# Patient Record
Sex: Female | Born: 1945 | Race: White | Hispanic: No | Marital: Single | Smoking: Never smoker
Health system: Southern US, Community
[De-identification: ages and names within clinical notes are randomized; demographics above are authoritative.]

## PROBLEM LIST (undated history)

## (undated) DIAGNOSIS — F419 Anxiety disorder, unspecified: Secondary | ICD-10-CM

## (undated) HISTORY — PX: TONSILLECTOMY: SUR1361

---

## 2001-06-09 ENCOUNTER — Emergency Department (HOSPITAL_COMMUNITY): Admission: EM | Admit: 2001-06-09 | Discharge: 2001-06-09 | Payer: Self-pay | Admitting: Emergency Medicine

## 2001-06-28 ENCOUNTER — Encounter: Payer: Self-pay | Admitting: Family Medicine

## 2001-06-28 ENCOUNTER — Ambulatory Visit (HOSPITAL_COMMUNITY): Admission: RE | Admit: 2001-06-28 | Discharge: 2001-06-28 | Payer: Self-pay | Admitting: Family Medicine

## 2004-01-21 ENCOUNTER — Ambulatory Visit (HOSPITAL_COMMUNITY): Admission: RE | Admit: 2004-01-21 | Discharge: 2004-01-21 | Payer: Self-pay | Admitting: Gastroenterology

## 2004-01-21 ENCOUNTER — Encounter (INDEPENDENT_AMBULATORY_CARE_PROVIDER_SITE_OTHER): Payer: Self-pay | Admitting: Specialist

## 2006-11-17 ENCOUNTER — Encounter: Admission: RE | Admit: 2006-11-17 | Discharge: 2006-11-17 | Payer: Self-pay | Admitting: Gastroenterology

## 2007-06-26 ENCOUNTER — Emergency Department (HOSPITAL_COMMUNITY): Admission: EM | Admit: 2007-06-26 | Discharge: 2007-06-26 | Payer: Self-pay | Admitting: Emergency Medicine

## 2008-01-18 ENCOUNTER — Encounter: Admission: RE | Admit: 2008-01-18 | Discharge: 2008-03-14 | Payer: Self-pay | Admitting: Psychology

## 2011-03-07 ENCOUNTER — Other Ambulatory Visit: Payer: Self-pay | Admitting: Gastroenterology

## 2011-04-01 NOTE — Op Note (Signed)
NAME:  Cynthia Henry, Cynthia Henry                  ACCOUNT NO.:  [id number]   MEDICAL RECORD NO.:  [id number]                   PATIENT TYPE:  AMB   LOCATION:  ENDO                                 FACILITY:  [facility]   PHYSICIAN:  Petra Kuba, M.D.                 DATE OF BIRTH:  06/04/1946   DATE OF PROCEDURE:  01/21/2004  DATE OF DISCHARGE:                                 OPERATIVE REPORT   PROCEDURE:  Colonoscopy with biopsy.   INDICATIONS:  Patient with multiple bowel movements a day.  Sounds like  proctitis or colitis, although no blood or frank diarrhea.   Consent was signed after risks, benefits, methods, options thoroughly  discussed in the office.   MEDICINE USED:  Versed 9, Demerol 75.   PROCEDURE:  Rectal inspection was pertinent for external hemorrhoids.  Small  visual exam was negative.  The video pediatric adjustable colonoscope was  inserted with some difficulty due to a tortuous colon.  This was able to be  advanced to the cecum.  This did require rolling her over on her back and  some abdominal pressure with withdrawing and readvancement to try to  decrease the loops.  No obvious abnormality was seen on insertion.  Specifically, no signs of proctitis, colitis, or Crohn's disease.  The scope  was inserted shortways into the terminal ileum, which was normal.  Better  documentation and a few random biopsies were obtained.  The scope was slowly  withdrawn.  The prep was adequate.  There was some liquid stool that  required washing and suction.  It was slowly withdrawn through the colon.  No abnormalities were seen except for a tiny sigmoid polyp which was cold  biopsied x4.  Random colon biopsies were obtained to rule out any  microscopic colitis.  Anorectal pull-through and retroflexion confirmed some  small hemorrhoids but no signs of colitis or proctitis.  The scope was  reinserted shortways up the left side of the colon.  Air was suctioned.  The  scope removed.  The  patient tolerated the procedure well.  There were no  obvious immediate complications.   DIAGNOSES:  1. Internal and external hemorrhoids.  2. Tortuous colon.  3. A tiny sigmoid polyp, cold biopsied.  4. Otherwise within normal limits to the terminal ileum, status post random     biopsies throughout.   PLAN:  Await pathology to rule out microscopic colitis.  Questionable  further workup and plans versus possibly a trial of fiber or bulking agents.  Based on pathology, we will recheck symptoms and either follow up or proceed  with further workup and plans.                                               Petra Kuba, M.D.    MEM/MEDQ  D:  01/21/2004  T:  01/21/2004  Job:  [id number]   cc:   Dellis Anes. Idell Pickles, M.D.  [address]  [place]  [place] [address]  Fax: [phone]

## 2011-08-29 LAB — I-STAT 8, (EC8 V) (CONVERTED LAB)
Chloride: 106
Glucose, Bld: 95
Potassium: 3.9
pCO2, Ven: 52 — ABNORMAL HIGH
pH, Ven: 7.332 — ABNORMAL HIGH

## 2011-08-29 LAB — HEPATIC FUNCTION PANEL
Albumin: 4.1
Bilirubin, Direct: 0.2
Indirect Bilirubin: 0.6
Total Bilirubin: 0.8
Total Protein: 7.4

## 2011-08-29 LAB — DIFFERENTIAL
Basophils Absolute: 0
Eosinophils Relative: 1
Lymphocytes Relative: 38
Lymphs Abs: 1.7
Neutro Abs: 2.5
Neutrophils Relative %: 57

## 2011-08-29 LAB — URINALYSIS, ROUTINE W REFLEX MICROSCOPIC
Glucose, UA: NEGATIVE
Nitrite: NEGATIVE
Specific Gravity, Urine: 1.006
pH: 6

## 2011-08-29 LAB — CBC
HCT: 42.1
Platelets: 193
WBC: 4.4

## 2011-08-29 LAB — RAPID URINE DRUG SCREEN, HOSP PERFORMED
Barbiturates: NOT DETECTED
Cocaine: NOT DETECTED
Opiates: NOT DETECTED

## 2011-08-29 LAB — ETHANOL: Alcohol, Ethyl (B): 366 — ABNORMAL HIGH

## 2011-08-29 LAB — PROTIME-INR: Prothrombin Time: 12.4

## 2011-08-29 LAB — POCT I-STAT CREATININE
Creatinine, Ser: 1
Operator id: 146091

## 2011-08-29 LAB — LIPASE, BLOOD: Lipase: 30

## 2014-12-17 ENCOUNTER — Emergency Department (HOSPITAL_BASED_OUTPATIENT_CLINIC_OR_DEPARTMENT_OTHER)
Admission: EM | Admit: 2014-12-17 | Discharge: 2014-12-17 | Disposition: A | Discharge status: Home / Self Care | Payer: Medicare Other | Attending: Emergency Medicine | Admitting: Emergency Medicine

## 2014-12-17 ENCOUNTER — Encounter (HOSPITAL_BASED_OUTPATIENT_CLINIC_OR_DEPARTMENT_OTHER): Payer: Self-pay | Admitting: *Deleted

## 2014-12-17 DIAGNOSIS — W1809XA Striking against other object with subsequent fall, initial encounter: Secondary | ICD-10-CM | POA: Insufficient documentation

## 2014-12-17 DIAGNOSIS — S0990XA Unspecified injury of head, initial encounter: Secondary | ICD-10-CM

## 2014-12-17 DIAGNOSIS — F419 Anxiety disorder, unspecified: Secondary | ICD-10-CM | POA: Insufficient documentation

## 2014-12-17 DIAGNOSIS — Y998 Other external cause status: Secondary | ICD-10-CM | POA: Insufficient documentation

## 2014-12-17 DIAGNOSIS — Y9389 Activity, other specified: Secondary | ICD-10-CM | POA: Insufficient documentation

## 2014-12-17 DIAGNOSIS — S0101XA Laceration without foreign body of scalp, initial encounter: Secondary | ICD-10-CM | POA: Insufficient documentation

## 2014-12-17 DIAGNOSIS — Y9289 Other specified places as the place of occurrence of the external cause: Secondary | ICD-10-CM | POA: Insufficient documentation

## 2014-12-17 HISTORY — DX: Anxiety disorder, unspecified: F41.9

## 2014-12-17 MED ORDER — LIDOCAINE-EPINEPHRINE 2 %-1:100000 IJ SOLN
INTRAMUSCULAR | Status: AC
  Administered 2014-12-17: 5 mL
  Filled 2014-12-17: qty 1

## 2014-12-17 NOTE — ED Notes (Signed)
MD at bedside. 

## 2014-12-17 NOTE — ED Notes (Signed)
Pt c/o laceration to posterior head by wood doors 12 hrs ago

## 2014-12-17 NOTE — ED Provider Notes (Signed)
CSN: [id number]     Arrival date & time 12/17/14  2038 History  This chart was scribed for Cynthia ChickMartha K Linker, MD by Gwenyth Oberatherine Macek, ED Scribe. This patient was seen in room MH06/MH06 and the patient's care was started at 8:42 PM.    Chief Complaint  Patient presents with  . Head Laceration   Patient is a 69 y.o. female presenting with scalp laceration. The history is provided by the patient. No language interpreter was used.  Head Laceration This is a new problem. The current episode started 6 to 12 hours ago. The problem occurs constantly. The problem has been gradually improving. Associated symptoms include headaches. Nothing aggravates the symptoms. The symptoms are relieved by ASA. She has tried nothing for the symptoms. The treatment provided mild relief.    HPI Comments: Cynthia CannerMeredith Lewis Henry is a 69 y.o. female with a history of anxiety who presents to the Emergency Department complaining of an intermittently bleeding laceration on her occipital scalp that occurred 12 hours ago. Pt states mild headache, elbow pain and knee pain as associated symptoms. She reports that she was putting dishes away when she hit her head on a cabinet and fell to the floor. She also hit her elbow and knee in the fall. Pt tried applying pressure to the wound with intermittent relief. She also took 2 Aspirin earlier today for the pain. Pt denies LOC as a result of the injury and remembers hitting the floor. Her last Tetanus was within the last 5 years. She does not typically take anti-coagulants. Pt denies vomiting and seizure as associated symptoms.   Past Medical History  Diagnosis Date  . Anxiety    Past Surgical History  Procedure Laterality Date  . Tonsillectomy     History reviewed. No pertinent family history. History  Substance Use Topics  . Smoking status: Never Smoker   . Smokeless tobacco: Not on file  . Alcohol Use: No   OB History    No data available     Review of Systems   Gastrointestinal: Negative for vomiting.  Musculoskeletal: Positive for arthralgias.  Skin: Positive for wound.  Neurological: Positive for headaches. Negative for seizures.  All other systems reviewed and are negative.  Allergies  Review of patient's allergies indicates no known allergies.  Home Medications   Prior to Admission medications   Medication Sig Start Date End Date Taking? Authorizing Provider  clonazePAM (KLONOPIN) 0.5 MG tablet Take 0.5 mg by mouth 2 (two) times daily as needed for anxiety.   Yes Historical Provider, MD   BP 152/114 mmHg  Pulse 108  Temp(Src) 98.3 F (36.8 C)  Resp 18  Ht 5\' 1"  (1.549 m)  Wt 112 lb (50.803 kg)  BMI 21.17 kg/m2  SpO2 99%   Physical Exam  Constitutional: She appears well-developed and well-nourished. No distress.  HENT:  Head: Normocephalic and atraumatic.  Eyes: Conjunctivae and EOM are normal.  Neck: Neck supple. No tracheal deviation present.  Cardiovascular: Normal rate.   Pulmonary/Chest: Effort normal. No respiratory distress.  Skin: Skin is warm and dry.  1 cm linear laceration on occipital scalp  Psychiatric: She has a normal mood and affect. Her behavior is normal.  Nursing note and vitals reviewed. Neck - no midline tenderness to palpation, no thoracic/lumbar tenderness to palpation ED Course  Procedures (including critical care time) DIAGNOSTIC STUDIES: Oxygen Saturation is 99% on RA, normal by my interpretation.    COORDINATION OF CARE: 9:09 PM Discussed treatment plan with pt at  bedside and pt agreed to plan. Advised pt to have staples removed on 2/8.   LACERATION REPAIR Performed by: Cynthia Chick, MD Consent: Verbal consent obtained. Risks and benefits: risks, benefits and alternatives were discussed Patient identity confirmed: provided demographic data Time out performed prior to procedure Prepped and Draped in normal sterile fashion Wound explored Laceration Location: Posterior head Laceration  Length: 1 cm linear on occipital scalp No Foreign Bodies seen or palpated Anesthesia: local infiltration Local anesthetic: lidocaine 2% w/ epinephrine Anesthetic total: 2 ml Irrigation method: syringe Amount of cleaning: standard Skin closure: Staple Number of sutures or staples: 2 Patient tolerance: Patient tolerated the procedure well with no immediate complications.  Labs Review Labs Reviewed - No data to display  Imaging Review No results found.   EKG Interpretation None      MDM   Final diagnoses:  Scalp laceration, initial encounter  Minor head injury, initial encounter    Pt presenting with laceration to posterior scalp that she sustained 12 hours ago.  Small amount of bleeding continues.  Wound irrigated, closed with 2 staples.  No other signs of significant head injury.  Pt does not take anticoagulants.  Discharged with strict return precautions.  Pt agreeable with plan.   I personally performed the services described in this documentation, which was scribed in my presence. The recorded information has been reviewed and is accurate.    Cynthia Chick, MD 12/17/14 2212

## 2014-12-17 NOTE — Discharge Instructions (Signed)
Return to the ED with any concerns including vomiting, seizure activity, changes in vision or speech, decreased level of alertness/lethary, or any other alarming symptoms

## 2016-10-11 ENCOUNTER — Other Ambulatory Visit: Payer: Self-pay | Admitting: Family Medicine

## 2017-07-18 ENCOUNTER — Ambulatory Visit: Payer: Medicare Other | Admitting: Physical Therapy

## 2017-07-26 ENCOUNTER — Ambulatory Visit: Payer: Medicare Other | Admitting: Physical Therapy

## 2017-08-03 ENCOUNTER — Ambulatory Visit: Payer: Medicare Other | Attending: Family Medicine | Admitting: Physical Therapy

## 2017-08-03 ENCOUNTER — Encounter: Payer: Self-pay | Admitting: Physical Therapy

## 2017-08-03 DIAGNOSIS — R42 Dizziness and giddiness: Secondary | ICD-10-CM | POA: Diagnosis not present

## 2017-08-03 DIAGNOSIS — R262 Difficulty in walking, not elsewhere classified: Secondary | ICD-10-CM | POA: Diagnosis present

## 2017-08-03 DIAGNOSIS — M6281 Muscle weakness (generalized): Secondary | ICD-10-CM | POA: Insufficient documentation

## 2017-08-03 NOTE — Therapy (Signed)
[facility] [place] [address] [place], [place], [address] Phone: [phone]   Fax:  [phone]  Physical Therapy Evaluation  Patient Details  Name: Cynthia Henry MRN: [id number] Date of Birth: [birth year] Referring Provider: Sigmund Hazel  Encounter Date: 08/03/2017      PT End of Session - 08/03/17 1613    Visit Number 1   Date for PT Re-Evaluation 10/03/17   PT Start Time 1440   PT Stop Time 1530   PT Time Calculation (min) 50 min   Activity Tolerance Patient tolerated treatment well   Behavior During Therapy [facility] for tasks assessed/performed;Anxious      Past Medical History:  Diagnosis Date  . Anxiety     Past Surgical History:  Procedure Laterality Date  . TONSILLECTOMY      There were no vitals filed for this visit.       Subjective Assessment - 08/03/17 1444    Subjective Patient reports that she has been having some dizziness when getting up from sitting, or when looking up.  She reports a fall about 18 months ago she is unsure if that is a reason for this.  She also reports that her knees pop and when they do she has to sit down.  She reports pain with the popping   Limitations Lifting;Walking;House hold activities   Patient Stated Goals have less dizziness, walk easier, less knee popping   Currently in Pain? Yes   Pain Score 2    Pain Location Knee   Pain Orientation Left;Right   Pain Descriptors / Indicators Sore;Sharp   Pain Type Acute pain   Pain Onset More than a month ago   Pain Frequency Intermittent   Aggravating Factors  squatting, stairs pain can be up to 7-81/0   Pain Relieving Factors ice and rest pain can be 2/10   Effect of Pain on Daily Activities fear of falling, difficulty walking and ADL's            [facility] PT Assessment - 08/03/17 0001      Assessment   Medical Diagnosis dizziness, knee popping, difficulty walking   Referring Provider Sigmund Hazel   Onset  Date/Surgical Date 07/03/17   Prior Therapy no     Precautions   Precautions None     Balance Screen   Has the patient fallen in the past 6 months No   Has the patient had a decrease in activity level because of a fear of falling?  Yes   Is the patient reluctant to leave their home because of a fear of falling?  Yes     Home Environment   Additional Comments has stairs, does housework, some yardwork and gardening     Prior Function   Level of Independence Independent   Vocation Retired   Leisure loves to Camera operator, she has to set up tents, carry items, walk on uneven ground     Posture/Postural Control   Posture Comments fwd head, rounded shoulders     ROM / Strength   AROM / PROM / Strength AROM;Strength     AROM   Overall AROM Comments WFL's     Strength   Overall Strength Comments 4-/5 for the hips and the knees     Flexibility   Soft Tissue Assessment /Muscle Length --  very tight HS and ITB     Palpation   Palpation comment has minor crepitus in the knees , some significant lateral  tracking of the patella     Ambulation/Gait   Gait Comments no device, she does tend to want to creep along with holding onto furniture and walls as she reports feeling off balance     Standardized Balance Assessment   Standardized Balance Assessment Berg Balance Test     Berg Balance Test   Sit to Stand Able to stand without using hands and stabilize independently   Standing Unsupported Able to stand safely 2 minutes   Sitting with Back Unsupported but Feet Supported on Floor or Stool Able to sit safely and securely 2 minutes   Stand to Sit Sits safely with minimal use of hands   Transfers Able to transfer safely, minor use of hands   Standing Unsupported with Eyes Closed Able to stand 10 seconds safely   Standing Ubsupported with Feet Together Able to place feet together independently and stand for 1 minute with supervision   From Standing, Reach Forward with  Outstretched Arm Can reach forward >12 cm safely (5")   From Standing Position, Pick up Object from Floor Able to pick up shoe, needs supervision   From Standing Position, Turn to Look Behind Over each Shoulder Looks behind one side only/other side shows less weight shift   Turn 360 Degrees Able to turn 360 degrees safely one side only in 4 seconds or less   Standing Unsupported, Alternately Place Feet on Step/Stool Able to stand independently and complete 8 steps >20 seconds   Standing Unsupported, One Foot in Front Able to plae foot ahead of the other independently and hold 30 seconds   Standing on One Leg Able to lift leg independently and hold equal to or more than 3 seconds   Total Score 47     High Level Balance   High Level Balance Comments had a lot of difficulty when standing on airex            Objective measurements completed on examination: See above findings.                  PT Education - 08/03/17 1613    Education provided Yes   Education Details standing 4 way kicks and sidestepping working on balance, quad sets for [employer]) Educated Patient   Methods Explanation;Demonstration;Handout   Comprehension Verbalized understanding          PT Short Term Goals - 08/03/17 1704      PT SHORT TERM GOAL #1   Title independent with initial HEP   Time 2   Period Weeks   Status New           PT Long Term Goals - 08/03/17 1704      PT LONG TERM GOAL #1   Title increase Berg balance score to 50/56   Time 8   Period Weeks   Status New     PT LONG TERM GOAL #2   Title report no issues when looking up   Time 8   Period Weeks   Status New     PT LONG TERM GOAL #3   Title reports walking on uneven surfaces with less anxiety and difficulty   Time 8   Period Weeks   Status New     PT LONG TERM GOAL #4   Title go up and down stairs step over step   Time 8   Period Weeks   Status New  Plan - 08/28/17 1614     Clinical Impression Statement Patient reports that she feels unsteady with walking, feels off balance, she reports dizziness, she reports a concussion about 18 months ago and a fall about a year ago.  Her BERG balance test score was 47/56, she was very fearful of uneven surfaces or with tandem standing.  She has crepitus in the knees with very tight HS and ITB, laterl tracking patella, tends to use walls and furniture to hold onto for balance   Clinical Presentation Stable   Clinical Decision Making Moderate   Rehab Potential Good   PT Frequency 2x / week   PT Duration 8 weeks   PT Treatment/Interventions Gait training;Stair training;Functional mobility training;Patient/family education;Neuromuscular re-education;Balance training;Therapeutic exercise;Therapeutic activities;Manual techniques   PT Next Visit Plan try Epley maneuver   Consulted and Agree with Plan of Care Patient      Patient will benefit from skilled therapeutic intervention in order to improve the following deficits and impairments:  Abnormal gait, Decreased activity tolerance, Decreased mobility, Decreased strength, Impaired flexibility, Pain, Difficulty walking  Visit Diagnosis: Dizziness and giddiness - Plan: PT plan of care cert/re-cert  Difficulty in walking, not elsewhere classified - Plan: PT plan of care cert/re-cert  Muscle weakness (generalized) - Plan: PT plan of care cert/re-cert      G-Codes - 08-28-17 1704    Functional Assessment Tool Used (Outpatient Only) foto 51% limitation   Functional Limitation Mobility: Walking and moving around   Mobility: Walking and Moving Around Current Status (M8413) At least 40 percent but less than 60 percent impaired, limited or restricted   Mobility: Walking and Moving Around Goal Status (K4401) At least 20 percent but less than 40 percent impaired, limited or restricted       Problem List There are no active problems to display for this patient.   Jearld Lesch., PT 2017-08-28, 5:15 PM  [facility] [address] [place], [place], [address] Phone: [phone]   Fax:  [phone]  Name: Matea Stanard MRN: [id number] Date of Birth: 17-Nov-1945

## 2017-08-08 ENCOUNTER — Encounter: Payer: Self-pay | Admitting: Physical Therapy

## 2017-08-08 ENCOUNTER — Ambulatory Visit: Payer: Medicare Other | Admitting: Physical Therapy

## 2017-08-08 DIAGNOSIS — R42 Dizziness and giddiness: Secondary | ICD-10-CM

## 2017-08-08 DIAGNOSIS — R262 Difficulty in walking, not elsewhere classified: Secondary | ICD-10-CM

## 2017-08-08 DIAGNOSIS — M6281 Muscle weakness (generalized): Secondary | ICD-10-CM

## 2017-08-08 NOTE — Therapy (Signed)
[facility] [address] [place], [place], [address] Phone: [phone]   Fax:  [phone]  Physical Therapy Treatment  Patient Details  Name: Cynthia Henry MRN: [id number] Date of Birth: [birth year] Referring Provider: Sigmund Hazel  Encounter Date: 08/08/2017      PT End of Session - 08/08/17 0914    Visit Number 2   Date for PT Re-Evaluation 10/03/17   PT Start Time 0830   PT Stop Time 0917   PT Time Calculation (min) 47 min   Activity Tolerance Patient tolerated treatment well   Behavior During Therapy [facility] for tasks assessed/performed;Anxious      Past Medical History:  Diagnosis Date  . Anxiety     Past Surgical History:  Procedure Laterality Date  . TONSILLECTOMY      There were no vitals filed for this visit.      Subjective Assessment - 08/08/17 0831    Subjective Patient reports that the exercises that I gave her were hard witht he balance.  She reports that she still gets dizzy with looking up or with bending over   Currently in Pain? No/denies                Vestibular Assessment - 08/08/17 0001      Symptom Behavior   Type of Dizziness Unsteady with head/body turns   Frequency of Dizziness almost daily   Duration of Dizziness seconds   Aggravating Factors Looking up to the ceiling;Forward bending   Relieving Factors Slow movements     Occulomotor Exam   Occulomotor Alignment Normal   Smooth Pursuits Intact     Vestibulo-Occular Reflex   VOR 1 Head Only (x 1 viewing) some delayed eye motions     Positional Testing   Dix-Hallpike Dix-Hallpike Right;Dix-Hallpike Left     Dix-Hallpike Right   Dix-Hallpike Right Duration 3-5 seconds   Dix-Hallpike Right Symptoms Upbeat, right rotatory nystagmus     Dix-Hallpike Left   Dix-Hallpike Left Duration none   Dix-Hallpike Left Symptoms No nystagmus                  Vestibular Treatment/Exercise - 08/08/17  0001      Vestibular Treatment/Exercise   Vestibular Treatment Provided Canalith Repositioning   Canalith Repositioning Epley Manuever Right;Epley Manuever Left   Gaze Exercises X1 Viewing Horizontal;X1 Viewing Vertical;Eye/Head Exercise Horizontal      EPLEY MANUEVER RIGHT   Number of Reps  1   Overall Response Improved Symptoms   Response Details  "feel a little off"      EPLEY MANUEVER LEFT   Number of Reps  1   Overall Response  No change     X1 Viewing Horizontal   Foot Position seated   Reps 10     X1 Viewing Vertical   Foot Position seated   Reps 10     Eye/Head Exercise Horizontal   Foot Position seated   Reps 10               PT Education - 08/08/17 0916    Education provided Yes   Education Details VOR exercises head turns and up and down, eye pursuits   Person(s) Educated Patient   Methods Demonstration;Explanation;Handout;Verbal cues   Comprehension Verbalized understanding;Returned demonstration          PT Short Term Goals - 08/03/17 1704      PT SHORT TERM GOAL #1   Title independent with initial HEP  Time 2   Period Weeks   Status New           PT Long Term Goals - 08/03/17 1704      PT LONG TERM GOAL #1   Title increase Berg balance score to 50/56   Time 8   Period Weeks   Status New     PT LONG TERM GOAL #2   Title report no issues when looking up   Time 8   Period Weeks   Status New     PT LONG TERM GOAL #3   Title reports walking on uneven surfaces with less anxiety and difficulty   Time 8   Period Weeks   Status New     PT LONG TERM GOAL #4   Title go up and down stairs step over step   Time 8   Period Weeks   Status New               Plan - 08/08/17 0914    Clinical Impression Statement Patient had a positive right Epley manuever, symptoms of dizziness and nystagmus for 3-5 seconds.  Less symptoms after the maneuver but felt "off".  Gave VOR exercises   PT Next Visit Plan assess and look at  balance exercises vs VOR   Consulted and Agree with Plan of Care Patient      Patient will benefit from skilled therapeutic intervention in order to improve the following deficits and impairments:  Abnormal gait, Decreased activity tolerance, Decreased mobility, Decreased strength, Impaired flexibility, Pain, Difficulty walking  Visit Diagnosis: Dizziness and giddiness  Difficulty in walking, not elsewhere classified  Muscle weakness (generalized)     Problem List There are no active problems to display for this patient.   Jearld Lesch., PT 08/08/2017, 9:17 AM  [facility] [address] [place], [place], [address] Phone: [phone]   Fax:  [phone]  Name: Cynthia Henry MRN: [id number] Date of Birth: 1946-06-12

## 2017-08-15 ENCOUNTER — Ambulatory Visit: Payer: Medicare Other | Attending: Family Medicine | Admitting: Physical Therapy

## 2017-08-15 ENCOUNTER — Encounter: Payer: Self-pay | Admitting: Physical Therapy

## 2017-08-15 DIAGNOSIS — R42 Dizziness and giddiness: Secondary | ICD-10-CM | POA: Insufficient documentation

## 2017-08-15 DIAGNOSIS — M6281 Muscle weakness (generalized): Secondary | ICD-10-CM | POA: Diagnosis present

## 2017-08-15 DIAGNOSIS — R262 Difficulty in walking, not elsewhere classified: Secondary | ICD-10-CM | POA: Insufficient documentation

## 2017-08-15 NOTE — Therapy (Signed)
[facility] [address] [place], [place], [address] Phone: [phone]   Fax:  [phone]  Physical Therapy Treatment  Patient Details  Name: Cynthia Henry MRN: [id number] Date of Birth: 1946-01-03 Referring Provider: Sigmund Hazel  Encounter Date: 08/15/2017      PT End of Session - 08/15/17 1019    Visit Number 3   Date for PT Re-Evaluation 10/03/17   PT Start Time 0922   PT Stop Time 1011   PT Time Calculation (min) 49 min   Activity Tolerance Patient tolerated treatment well   Behavior During Therapy [facility] for tasks assessed/performed;Anxious      Past Medical History:  Diagnosis Date  . Anxiety     Past Surgical History:  Procedure Laterality Date  . TONSILLECTOMY      There were no vitals filed for this visit.      Subjective Assessment - 08/15/17 0923    Subjective Patient reports that she has tried the exercises some but they make her dizzy.  Reports that she has a lot of stress in her life right now.  Had some dental and doctor appointments last week   Currently in Pain? Yes   Pain Score 2    Pain Location Knee                         [facility] Adult PT Treatment/Exercise - 08/15/17 0001      High Level Balance   High Level Balance Activities Side stepping;Backward walking;Negotiating over obstacles;Tandem walking   High Level Balance Comments light HHA     Exercises   Exercises Knee/Hip     Knee/Hip Exercises: Aerobic   Nustep Level 3 x 4 minutes     Knee/Hip Exercises: Machines for Strengthening   Cybex Knee Extension 5# 2x10   Cybex Knee Flexion 15# 2x15     Knee/Hip Exercises: Standing   Hip Flexion 2 sets;10 reps   Hip Flexion Limitations 2#   Hip Abduction 2 sets;10 reps   Abduction Limitations 2#         Vestibular Treatment/Exercise - 08/15/17 0001       EPLEY MANUEVER RIGHT   Number of Reps  2   Overall Response Symptoms Resolved   Response  Details  "Just feel off", first time 10 seconds with the first motion of down and looking right, no other symptoms after that     X1 Viewing Horizontal   Foot Position seated   Reps 10                 PT Short Term Goals - 08/15/17 1021      PT SHORT TERM GOAL #1   Title independent with initial HEP   Status Achieved           PT Long Term Goals - 08/03/17 1704      PT LONG TERM GOAL #1   Title increase Berg balance score to 50/56   Time 8   Period Weeks   Status New     PT LONG TERM GOAL #2   Title report no issues when looking up   Time 8   Period Weeks   Status New     PT LONG TERM GOAL #3   Title reports walking on uneven surfaces with less anxiety and difficulty   Time 8   Period Weeks   Status New     PT LONG TERM  GOAL #4   Title go up and down stairs step over step   Time 8   Period Weeks   Status New               Plan - 08/15/17 1019    Clinical Impression Statement Had nystaggmaus for 10 seconds on right epley, after that no other symptoms and no dizziness.  She is fearful and anxious of most activities   PT Next Visit Plan continue to look at her balance   Consulted and Agree with Plan of Care Patient      Patient will benefit from skilled therapeutic intervention in order to improve the following deficits and impairments:     Visit Diagnosis: Dizziness and giddiness  Difficulty in walking, not elsewhere classified  Muscle weakness (generalized)     Problem List There are no active problems to display for this patient.   Jearld Lesch., PT 08/15/2017, 10:21 AM  [facility] [address] [place], [place], [address] Phone: [phone]   Fax:  [phone]  Name: Cynthia Henry MRN: [id number] Date of Birth: 11-13-1946

## 2017-08-22 ENCOUNTER — Ambulatory Visit: Payer: Medicare Other | Admitting: Physical Therapy

## 2017-08-22 ENCOUNTER — Encounter: Payer: Self-pay | Admitting: Physical Therapy

## 2017-08-22 DIAGNOSIS — R42 Dizziness and giddiness: Secondary | ICD-10-CM

## 2017-08-22 DIAGNOSIS — R262 Difficulty in walking, not elsewhere classified: Secondary | ICD-10-CM

## 2017-08-22 DIAGNOSIS — M6281 Muscle weakness (generalized): Secondary | ICD-10-CM

## 2017-08-22 NOTE — Therapy (Signed)
[facility] [address] [place], [place], [address] Phone: [phone]   Fax:  [phone]  Physical Therapy Treatment  Patient Details  Name: Cynthia Henry MRN: [id number] Date of Birth: 02-10-1946 Referring Provider: Sigmund Hazel  Encounter Date: 08/22/2017      PT End of Session - 08/22/17 1144    Visit Number 4   Date for PT Re-Evaluation 10/03/17   PT Start Time 0838   PT Stop Time 0927   PT Time Calculation (min) 49 min   Activity Tolerance Patient tolerated treatment well   Behavior During Therapy [facility] for tasks assessed/performed;Anxious      Past Medical History:  Diagnosis Date  . Anxiety     Past Surgical History:  Procedure Laterality Date  . TONSILLECTOMY      There were no vitals filed for this visit.      Subjective Assessment - 08/22/17 0842    Subjective Reports that she was at 2 craft shows over the weekend, reports 2 full 12 hour days, reports that she is tired and still has some dizziness   Currently in Pain? No/denies                         [facility] Adult PT Treatment/Exercise - 08/22/17 0001      Ambulation/Gait   Gait Comments gait outside, on uneven surfaces, slopes, grass, rocks     High Level Balance   High Level Balance Activities Side stepping;Backward walking;Negotiating over obstacles;Tandem walking   High Level Balance Comments airex balance activities     Knee/Hip Exercises: Aerobic   Nustep Level 4 x 5 minutes     Knee/Hip Exercises: Machines for Strengthening   Cybex Knee Extension 5# 2x10   Cybex Knee Flexion 15# 2x15         Vestibular Treatment/Exercise - 08/22/17 0001      Vestibular Treatment/Exercise   Vestibular Treatment Provided Canalith Repositioning   Canalith Repositioning Epley Manuever Right;Epley Manuever Left   Habituation Exercises Animal nutritionist   Gaze Exercises X1 Viewing Horizontal;X1 Viewing Vertical;Eye/Head  Exercise Horizontal;Eye/Head Exercise Vertical      EPLEY MANUEVER RIGHT   Number of Reps  1   Overall Response Symptoms Resolved   Response Details  light headed     X1 Viewing Horizontal   Foot Position seated   Reps 10     X1 Viewing Vertical   Foot Position seated   Reps 10     Eye/Head Exercise Horizontal   Foot Position seated   Reps 10     Eye/Head Exercise Vertical   Foot Position seated   Reps 10                 PT Short Term Goals - 08/15/17 1021      PT SHORT TERM GOAL #1   Title independent with initial HEP   Status Achieved           PT Long Term Goals - 08/22/17 1146      PT LONG TERM GOAL #1   Title increase Berg balance score to 50/56   Status On-going     PT LONG TERM GOAL #2   Title report no issues when looking up   Status On-going     PT LONG TERM GOAL #3   Title reports walking on uneven surfaces with less anxiety and difficulty   Status On-going     PT  LONG TERM GOAL #4   Title go up and down stairs step over step   Status Partially Met               Plan - 08/22/17 1145    Clinical Impression Statement Mild symptoms of dizziness with right Epley, symptoms resolve in less than 5 seconds and then other motions now ithout symptoms, she is very leary of doing any uneven terrain activities   PT Next Visit Plan continue to look at her balance   Consulted and Agree with Plan of Care Patient      Patient will benefit from skilled therapeutic intervention in order to improve the following deficits and impairments:  Abnormal gait, Decreased activity tolerance, Decreased mobility, Decreased strength, Impaired flexibility, Pain, Difficulty walking  Visit Diagnosis: Dizziness and giddiness  Difficulty in walking, not elsewhere classified  Muscle weakness (generalized)     Problem List There are no active problems to display for this patient.   Sumner Boast., PT 08/22/2017, 11:47 AM  [facility] [place] [address] [place], [place], [address] Phone: [phone]   Fax:  [phone]  Name: Cynthia Henry MRN: [id number] Date of Birth: [birth year]

## 2018-05-03 ENCOUNTER — Emergency Department (HOSPITAL_BASED_OUTPATIENT_CLINIC_OR_DEPARTMENT_OTHER)
Admission: EM | Admit: 2018-05-03 | Discharge: 2018-05-04 | Disposition: A | Discharge status: Home / Self Care | Payer: Medicare Other | Attending: Emergency Medicine | Admitting: Emergency Medicine

## 2018-05-03 ENCOUNTER — Other Ambulatory Visit: Payer: Self-pay

## 2018-05-03 ENCOUNTER — Encounter (HOSPITAL_BASED_OUTPATIENT_CLINIC_OR_DEPARTMENT_OTHER): Payer: Self-pay

## 2018-05-03 ENCOUNTER — Emergency Department (HOSPITAL_BASED_OUTPATIENT_CLINIC_OR_DEPARTMENT_OTHER): Payer: Medicare Other

## 2018-05-03 DIAGNOSIS — Y999 Unspecified external cause status: Secondary | ICD-10-CM | POA: Diagnosis not present

## 2018-05-03 DIAGNOSIS — S0993XA Unspecified injury of face, initial encounter: Secondary | ICD-10-CM | POA: Diagnosis present

## 2018-05-03 DIAGNOSIS — S0990XA Unspecified injury of head, initial encounter: Secondary | ICD-10-CM

## 2018-05-03 DIAGNOSIS — Y9389 Activity, other specified: Secondary | ICD-10-CM | POA: Insufficient documentation

## 2018-05-03 DIAGNOSIS — T7840XA Allergy, unspecified, initial encounter: Secondary | ICD-10-CM | POA: Diagnosis not present

## 2018-05-03 DIAGNOSIS — W57XXXA Bitten or stung by nonvenomous insect and other nonvenomous arthropods, initial encounter: Secondary | ICD-10-CM | POA: Insufficient documentation

## 2018-05-03 DIAGNOSIS — R55 Syncope and collapse: Secondary | ICD-10-CM | POA: Insufficient documentation

## 2018-05-03 DIAGNOSIS — S0083XA Contusion of other part of head, initial encounter: Secondary | ICD-10-CM | POA: Insufficient documentation

## 2018-05-03 DIAGNOSIS — S60561A Insect bite (nonvenomous) of right hand, initial encounter: Secondary | ICD-10-CM | POA: Insufficient documentation

## 2018-05-03 DIAGNOSIS — Y92009 Unspecified place in unspecified non-institutional (private) residence as the place of occurrence of the external cause: Secondary | ICD-10-CM | POA: Diagnosis not present

## 2018-05-03 LAB — CBC
HCT: 39.5 % (ref 36.0–46.0)
Hemoglobin: 14.2 g/dL (ref 12.0–15.0)
MCH: 33.7 pg (ref 26.0–34.0)
MCHC: 35.9 g/dL (ref 30.0–36.0)
MCV: 93.8 fL (ref 78.0–100.0)
PLATELETS: 164 10*3/uL (ref 150–400)
RBC: 4.21 MIL/uL (ref 3.87–5.11)
RDW: 11.6 % (ref 11.5–15.5)
WBC: 5.6 10*3/uL (ref 4.0–10.5)

## 2018-05-03 LAB — BASIC METABOLIC PANEL
Anion gap: 14 (ref 5–15)
BUN: 9 mg/dL (ref 6–20)
CHLORIDE: 95 mmol/L — AB (ref 101–111)
CO2: 22 mmol/L (ref 22–32)
CREATININE: 0.67 mg/dL (ref 0.44–1.00)
Calcium: 8.8 mg/dL — ABNORMAL LOW (ref 8.9–10.3)
GFR calc Af Amer: >60 mL/min (ref >60)
GFR calc non Af Amer: >60 mL/min (ref >60)
Glucose, Bld: 109 mg/dL — ABNORMAL HIGH (ref 65–99)
Potassium: 3.8 mmol/L (ref 3.5–5.1)
SODIUM: 131 mmol/L — AB (ref 135–145)

## 2018-05-03 MED ORDER — FAMOTIDINE 20 MG PO TABS
20.0000 mg | ORAL_TABLET | Freq: Once | ORAL | Status: AC
  Administered 2018-05-03: 20 mg via ORAL
  Filled 2018-05-03: qty 1

## 2018-05-03 MED ORDER — SODIUM CHLORIDE 0.9 % IV BOLUS
1000.0000 mL | Freq: Once | INTRAVENOUS | Status: AC
  Administered 2018-05-03: 1000 mL via INTRAVENOUS

## 2018-05-03 MED ORDER — DIPHENHYDRAMINE HCL 50 MG/ML IJ SOLN
25.0000 mg | Freq: Once | INTRAMUSCULAR | Status: AC
  Administered 2018-05-03: 25 mg via INTRAVENOUS
  Filled 2018-05-03: qty 1

## 2018-05-03 NOTE — ED Notes (Signed)
Patient transported to CT 

## 2018-05-03 NOTE — ED Notes (Signed)
Pt on the monitor 

## 2018-05-03 NOTE — ED Triage Notes (Addendum)
Pt passed out approx 730pm-concerned ?insect bite/bee sting to right hand approx 710p-pt states she has had severe allergic reactions-right hand and right UE redness noted-pt NAD-steady gait

## 2018-05-04 NOTE — ED Notes (Signed)
EDP aware of VS 

## 2018-05-04 NOTE — ED Provider Notes (Signed)
[facility] EMERGENCY DEPARTMENT Provider Note   CSN: [id number] Arrival date & time: 05/03/18  2059     History   Chief Complaint Chief Complaint  Patient presents with  . Loss of Consciousness    HPI Cynthia Henry is a 72 y.o. female.  HPI Patient is a 72 year old female presents to the emergency department after an insect bite and swelling to the right hand.  She states that she was possibly bit by a hornet on her right hand and developed pain and swelling and she had gone in the house to put a Band-Aid on when she suddenly had a syncopal episode and struck her right face on marble floors.  She was found by family to have a hematoma of her right periorbital region and right forehead and was found by EMS to be hypotensive on scene with blood pressure in the 70s.  She states right before the event she became extremely lightheaded and weak.  She had no preceding palpitations or chest pain.  At this time she feels much better.  No chest pain or shortness of breath.  She reports ongoing mild itching pain and swelling of the dorsum of her right hand.  She has taken Benadryl prior to evaluation.  She is never had severe allergic reactions before.  No history of anaphylaxis.  No history of prior syncope.  No preceding chest pain.   Past Medical History:  Diagnosis Date  . Anxiety     There are no active problems to display for this patient.   Past Surgical History:  Procedure Laterality Date  . TONSILLECTOMY       OB History   None      Home Medications    Prior to Admission medications   Medication Sig Start Date End Date Taking? Authorizing Provider  clonazePAM (KLONOPIN) 0.5 MG tablet Take 0.5 mg by mouth 2 (two) times daily as needed for anxiety.    [provider]    Family History No family history on file.  Social History Social History   Tobacco Use  . Smoking status: Never Smoker  . Smokeless tobacco: Never Used  Substance Use  Topics  . Alcohol use: Yes    Comment: occ  . Drug use: No     Allergies   Pantoprazole sodium   Review of Systems Review of Systems  All other systems reviewed and are negative.    Physical Exam Updated Vital Signs BP (!) 164/103   Pulse (!) 111   Temp 98.1 F (36.7 C) (Oral)   Resp 18   Ht 5\' 1"  (1.549 m)   Wt 55.8 kg (123 lb)   SpO2 95%   BMI 23.24 kg/m   Physical Exam  Constitutional: She is oriented to person, place, and time. She appears well-developed and well-nourished. No distress.  HENT:  Head: Normocephalic and atraumatic.  Eyes: Pupils are equal, round, and reactive to light. EOM are normal.  Neck: Normal range of motion.  Cardiovascular: Normal rate, regular rhythm and normal heart sounds.  Pulmonary/Chest: Effort normal and breath sounds normal.  Abdominal: Soft. She exhibits no distension. There is no tenderness.  Musculoskeletal: Normal range of motion.  Erythema and swelling of the dorsum of her right hand consistent with localized allergic reaction  Neurological: She is alert and oriented to person, place, and time.  5/5 strength in major muscle groups of  bilateral upper and lower extremities. Speech normal. No facial asymetry.   Skin: Skin is warm  and dry.  Psychiatric: She has a normal mood and affect. Judgment normal.  Nursing note and vitals reviewed.    ED Treatments / Results  Labs (all labs ordered are listed, but only abnormal results are displayed) Labs Reviewed  BASIC METABOLIC PANEL - Abnormal; Notable for the following components:      Result Value   Sodium 131 (*)    Chloride 95 (*)    Glucose, Bld 109 (*)    Calcium 8.8 (*)    All other components within normal limits  CBC    EKG EKG Interpretation  Date/Time:  Thursday May 03 2018 21:08:14 EDT Ventricular Rate:  83 PR Interval:  164 QRS Duration: 74 QT Interval:  384 QTC Calculation: 451 R Axis:   45 Text Interpretation:  Normal sinus rhythm Low voltage QRS  Septal infarct , age undetermined Abnormal ECG No old tracing to compare Confirmed by Azalia Bilisampos, Tonda Wiederhold ([id number]) on 05/03/2018 10:33:22 PM   Radiology Ct Head Wo Contrast  Result Date: 05/03/2018 CLINICAL DATA:  Syncopal episode with right frontal hematoma EXAM: CT HEAD WITHOUT CONTRAST TECHNIQUE: Contiguous axial images were obtained from the base of the skull through the vertex without intravenous contrast. COMPARISON:  CT brain 06/26/2007 FINDINGS: Brain: No acute territorial infarction or intracranial hemorrhage is visualized. 3 mm hyperdense focus near the anterior roof of the third ventricle, may represent tiny hemorrhagic or proteinaceous colloid cyst and mild small vessel ischemic changes of the white matter. Stable ventricle size. Atrophy Vascular: No hyperdense vessels.  Carotid vascular calcification Skull: No fracture Sinuses/Orbits: No acute finding. Other: Moderate right forehead hematoma IMPRESSION: 1. No definite CT evidence for acute intracranial abnormality. 2. Small hyperdense focus in the anterior roof of third ventricle, possible small hemorrhagic or proteinaceous colloid cyst 3. Atrophy with mild small vessel ischemic changes of the white matter 4. Moderate right forehead hematoma Electronically Signed   By: Jasmine PangKim  Fujinaga M.D.   On: 05/03/2018 23:04    Procedures Procedures (including critical care time)  Medications Ordered in ED Medications  sodium chloride 0.9 % bolus 1,000 mL (0 mLs Intravenous Stopped 05/03/18 2311)  diphenhydrAMINE (BENADRYL) injection 25 mg (25 mg Intravenous Given 05/03/18 2240)  famotidine (PEPCID) tablet 20 mg (20 mg Oral Given 05/03/18 2240)     Initial Impression / Assessment and Plan / ED Course  I have reviewed the triage vital signs and the nursing notes.  Pertinent labs & imaging results that were available during my care of the patient were reviewed by me and considered in my medical decision making (see chart for details).     Patient  observed in the emergency department.  She feels much better at this time.  She ambulated the emergency department without difficulty. She has had significant improvement in the swelling and erythema of her right hand with Benadryl and Pepcid.  I suspect her syncopal episode was secondary to vasovagal syncope in the setting of allergic reaction.  I do not believe this is anaphylaxis.  She feels much better.  Discharged home in good condition.  Recommended every 6 hour as needed Benadryl  Well-appearing.  Patient understands return to the ER for new or worsening symptoms   Final Clinical Impressions(s) / ED Diagnoses   Final diagnoses:  Vasovagal syncope  Acute head injury, initial encounter  Traumatic hematoma of forehead, initial encounter  Allergic reaction, initial encounter    ED Discharge Orders    None       Azalia Bilisampos, Veryl Abril, MD 05/04/18 0008

## 2019-01-21 DIAGNOSIS — R05 Cough: Secondary | ICD-10-CM | POA: Diagnosis not present

## 2019-01-21 DIAGNOSIS — R1310 Dysphagia, unspecified: Secondary | ICD-10-CM | POA: Diagnosis not present

## 2019-05-31 DIAGNOSIS — R05 Cough: Secondary | ICD-10-CM | POA: Diagnosis not present

## 2019-05-31 DIAGNOSIS — R1310 Dysphagia, unspecified: Secondary | ICD-10-CM | POA: Diagnosis not present

## 2019-06-11 ENCOUNTER — Ambulatory Visit: Payer: Medicare HMO | Admitting: Podiatry

## 2019-06-11 ENCOUNTER — Encounter: Payer: Self-pay | Admitting: Podiatry

## 2019-06-11 ENCOUNTER — Other Ambulatory Visit: Payer: Self-pay

## 2019-06-11 VITALS — Temp 98.1°F

## 2019-06-11 DIAGNOSIS — B351 Tinea unguium: Secondary | ICD-10-CM | POA: Diagnosis not present

## 2019-06-18 NOTE — Progress Notes (Signed)
Subjective:   Patient ID: Cynthia Henry, female   DOB: 73 y.o.   MRN: [id number]   HPI 73 year old female presents the office today for concerns of her left big toenail becoming thick for several years.  She states she was around her nails, in the past.  She states is now thick and discolored.  She thinks a new nail may be growing underneath the nail.  She gets pain like it is thick but denies any redness or drainage to the nail site.   Review of Systems  All other systems reviewed and are negative.  Past Medical History:  Diagnosis Date  . Anxiety     Past Surgical History:  Procedure Laterality Date  . TONSILLECTOMY       Current Outpatient Medications:  .  clonazePAM (KLONOPIN) 0.5 MG tablet, Take 0.5 mg by mouth 2 (two) times daily as needed for anxiety., Disp: , Rfl:  .  EPINEPHrine 0.3 mg/0.3 mL IJ SOAJ injection, INJECT INTRAMUSCULARLY AS DIRECTED, Disp: , Rfl:   Allergies  Allergen Reactions  . Pantoprazole Sodium          Objective:  Physical Exam  General: AAO x3, NAD  Dermatological: Left hallux nail is significantly hypertrophic, dystrophic with yellow-brown discoloration.  There is no edema, erythema, drainage or pus or signs of infection.  No open lesions.  Vascular: Dorsalis Pedis artery and Posterior Tibial artery pedal pulses are 2/4 bilateral with immedate capillary fill time. There is no pain with calf compression, swelling, warmth, erythema.   Neruologic: Grossly intact via light touch bilateral.  Musculoskeletal: No gross boney pedal deformities bilateral. No pain, crepitus, or limitation noted with foot and ankle range of motion bilateral. Muscular strength 5/5 in all groups tested bilateral.  Gait: Unassisted, Nonantalgic.      Assessment:   Left hallux onychodystrophy     Plan:  -Treatment options discussed including all alternatives, risks, and complications -Etiology of symptoms were discussed -We discussed nail removal but  she wished to hold off.  Did sharply debride the nail without any complications or bleeding.  I sent the nail for culture to [facility] labs.  Will await culture for further treatment.  Trula Slade DPM

## 2019-06-21 ENCOUNTER — Encounter: Payer: Self-pay | Admitting: Podiatry

## 2019-06-21 NOTE — Progress Notes (Signed)
Patient sent Cranford Mon, CMA a picture via text of her toenails. There is clearing along the proximal 1/3 of the nail. The distal part is still yellow and thick. Will do 30 more days of lamisil. Sent to pharmacy.

## 2019-06-26 ENCOUNTER — Telehealth: Payer: Self-pay | Admitting: *Deleted

## 2019-06-26 MED ORDER — NONFORMULARY OR COMPOUNDED ITEM: 5 refills | Status: AC

## 2019-06-26 NOTE — Telephone Encounter (Signed)
-----   Message from Trula Slade, DPM sent at 06/21/2019  2:14 PM EDT ----- Val- please let her know that the culture did show fungus. We can do oral Lamisil if she would like but would need to check CBC and LFT prior. If she did not want to do oral then there are topicals that we can do and I would use the one through [place]. Thanks.

## 2019-06-26 NOTE — Telephone Encounter (Signed)
I informed pt of Dr. Leigh Aurora review of results and recommendations. Pt states she would like to use the topical. I informed pt she would receive a call from [facility] and they would call with insurance coverage and delivery information. Faxed orders to [facility].

## 2019-07-01 DIAGNOSIS — K219 Gastro-esophageal reflux disease without esophagitis: Secondary | ICD-10-CM | POA: Diagnosis not present

## 2019-07-01 DIAGNOSIS — R1313 Dysphagia, pharyngeal phase: Secondary | ICD-10-CM | POA: Diagnosis not present

## 2019-07-01 DIAGNOSIS — J302 Other seasonal allergic rhinitis: Secondary | ICD-10-CM | POA: Diagnosis not present

## 2019-07-01 DIAGNOSIS — Z7982 Long term (current) use of aspirin: Secondary | ICD-10-CM | POA: Diagnosis not present

## 2019-07-02 ENCOUNTER — Other Ambulatory Visit: Payer: Self-pay | Admitting: Otolaryngology

## 2019-07-02 DIAGNOSIS — R1313 Dysphagia, pharyngeal phase: Secondary | ICD-10-CM

## 2019-07-02 DIAGNOSIS — R05 Cough: Secondary | ICD-10-CM

## 2019-07-02 DIAGNOSIS — R059 Cough, unspecified: Secondary | ICD-10-CM

## 2019-07-02 DIAGNOSIS — K219 Gastro-esophageal reflux disease without esophagitis: Secondary | ICD-10-CM

## 2019-07-10 ENCOUNTER — Other Ambulatory Visit: Payer: Medicare Other

## 2019-07-17 ENCOUNTER — Ambulatory Visit
Admission: RE | Admit: 2019-07-17 | Discharge: 2019-07-17 | Disposition: A | Discharge status: Home / Self Care | Payer: Medicare Other | Source: Ambulatory Visit | Attending: Otolaryngology | Admitting: Otolaryngology

## 2019-07-17 DIAGNOSIS — K219 Gastro-esophageal reflux disease without esophagitis: Secondary | ICD-10-CM

## 2019-07-17 DIAGNOSIS — R1313 Dysphagia, pharyngeal phase: Secondary | ICD-10-CM

## 2019-07-17 DIAGNOSIS — R05 Cough: Secondary | ICD-10-CM | POA: Diagnosis not present

## 2019-07-17 DIAGNOSIS — R1310 Dysphagia, unspecified: Secondary | ICD-10-CM | POA: Diagnosis not present

## 2019-07-17 DIAGNOSIS — R059 Cough, unspecified: Secondary | ICD-10-CM

## 2019-07-29 DIAGNOSIS — R69 Illness, unspecified: Secondary | ICD-10-CM | POA: Diagnosis not present

## 2019-08-13 DIAGNOSIS — H6123 Impacted cerumen, bilateral: Secondary | ICD-10-CM | POA: Diagnosis not present

## 2019-08-13 DIAGNOSIS — K219 Gastro-esophageal reflux disease without esophagitis: Secondary | ICD-10-CM | POA: Diagnosis not present

## 2019-08-13 DIAGNOSIS — J302 Other seasonal allergic rhinitis: Secondary | ICD-10-CM | POA: Diagnosis not present

## 2019-08-27 DIAGNOSIS — H6122 Impacted cerumen, left ear: Secondary | ICD-10-CM | POA: Diagnosis not present

## 2019-08-27 DIAGNOSIS — H9193 Unspecified hearing loss, bilateral: Secondary | ICD-10-CM | POA: Diagnosis not present

## 2019-09-10 DIAGNOSIS — H903 Sensorineural hearing loss, bilateral: Secondary | ICD-10-CM | POA: Diagnosis not present

## 2019-09-10 DIAGNOSIS — H9193 Unspecified hearing loss, bilateral: Secondary | ICD-10-CM | POA: Diagnosis not present

## 2019-10-29 DIAGNOSIS — H43399 Other vitreous opacities, unspecified eye: Secondary | ICD-10-CM | POA: Diagnosis not present

## 2019-10-29 DIAGNOSIS — H524 Presbyopia: Secondary | ICD-10-CM | POA: Diagnosis not present

## 2021-06-02 IMAGING — DX DG CHEST 2V
2 series · 2 of 2 positions shown · non-contrast
Comparison: Chest radiographs 06/26/2007.

CLINICAL DATA: Intermittent cough for 2 years.

EXAM:
CHEST - 2 VIEW

[dg chest 2 view (1 of 2)]
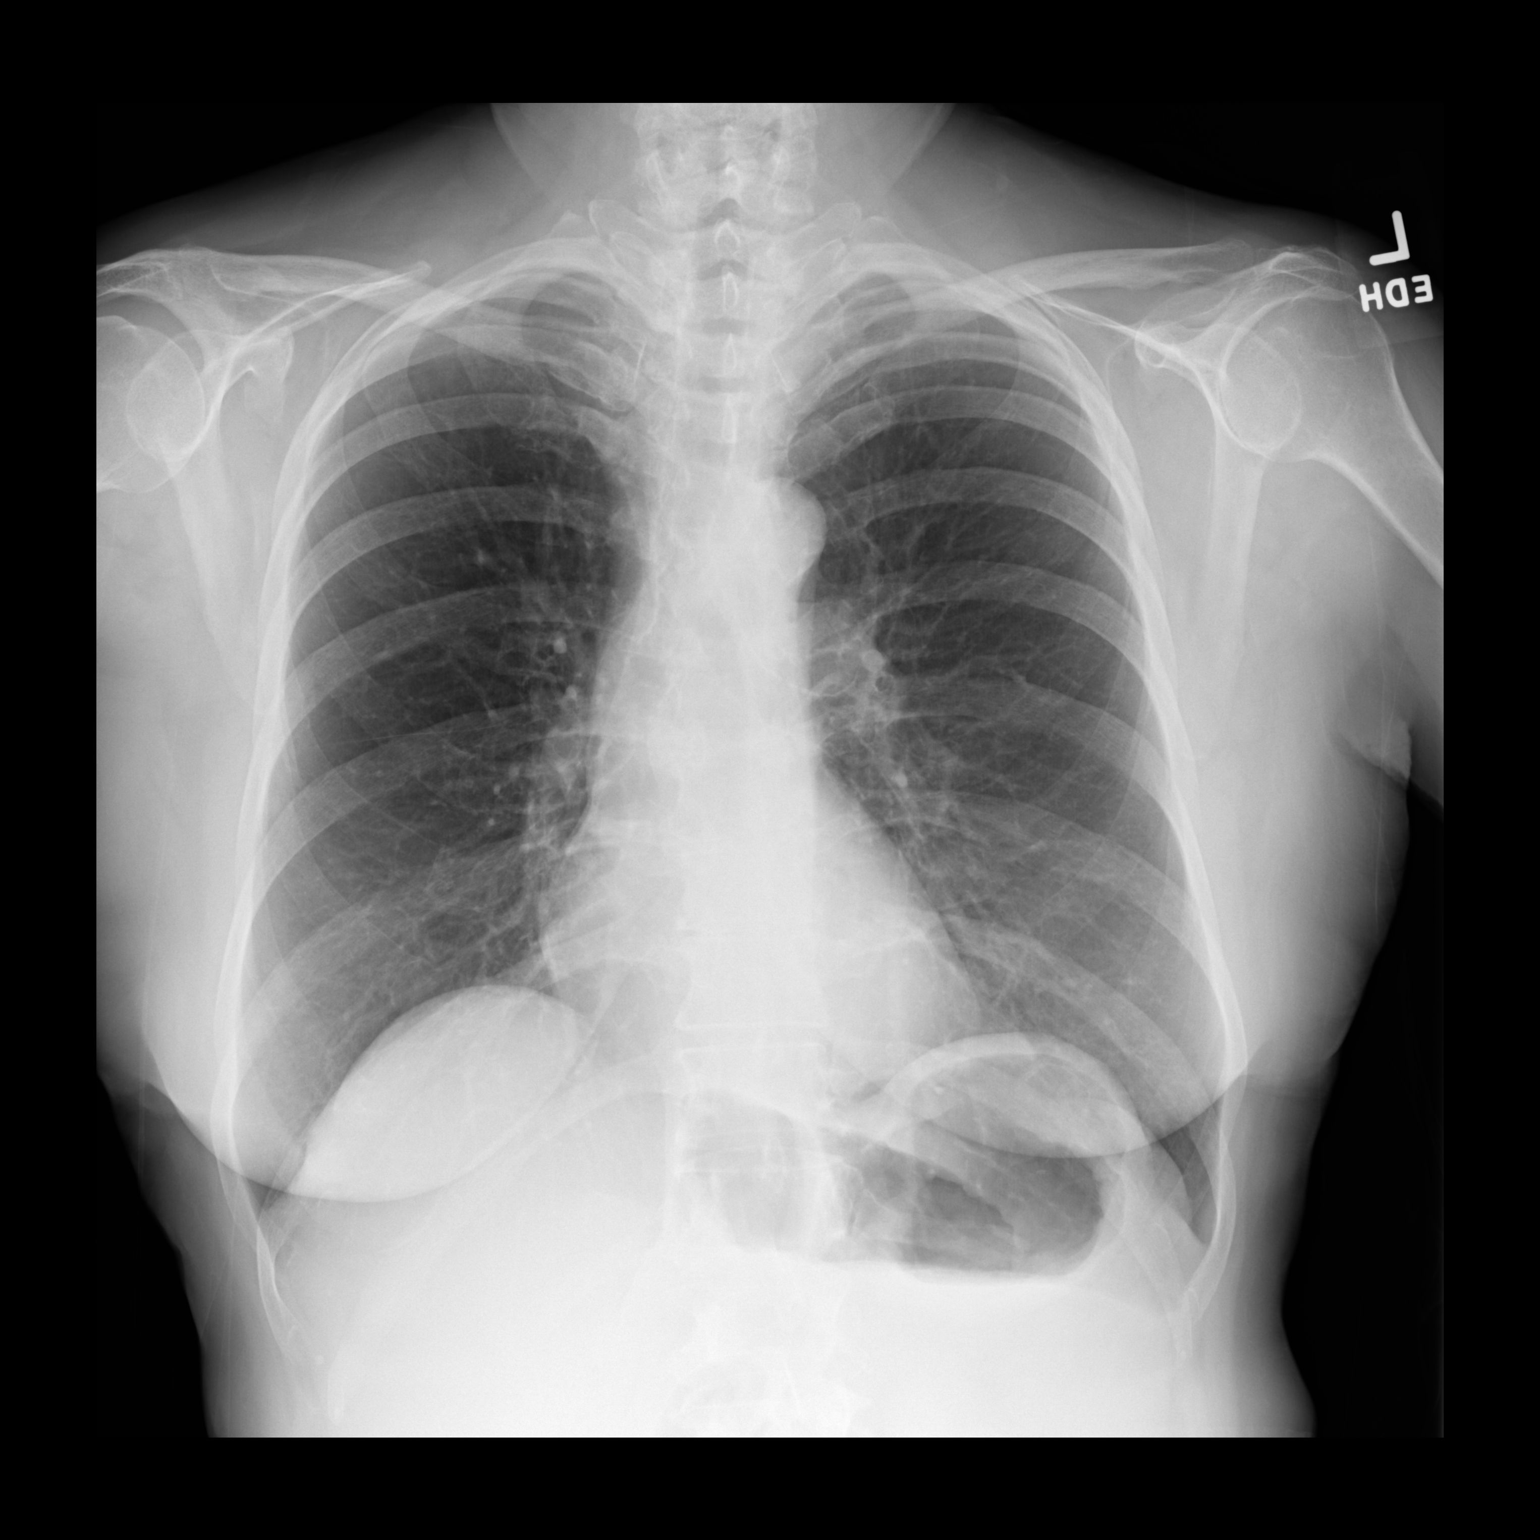

[dg chest 2 view (2 of 2)]
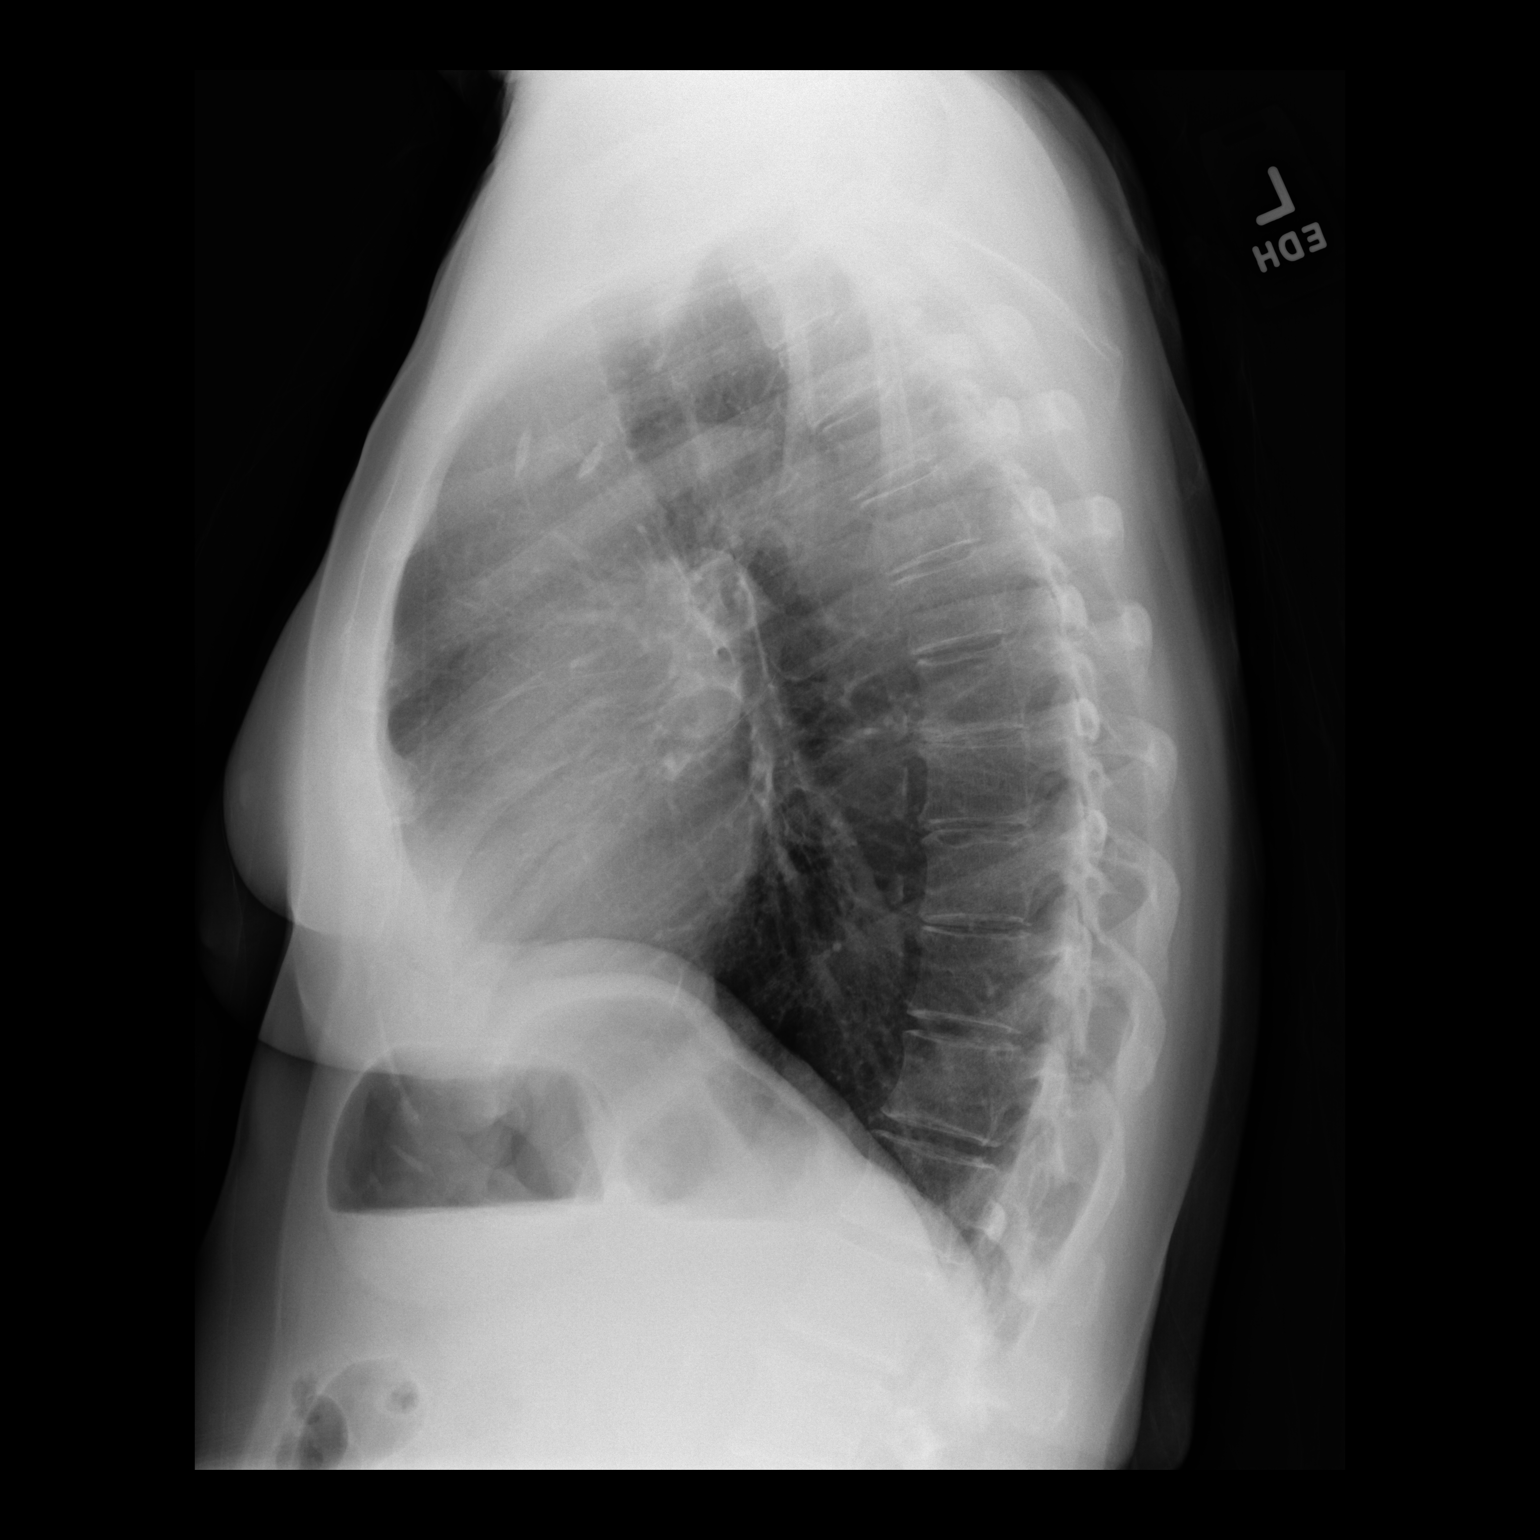

[2 of 2 positions shown; findings below may reference images not displayed]

FINDINGS: The heart size and mediastinal contours are stable. There is mild
atherosclerosis of the great vessels. The lungs are clear. There is
no pleural effusion or pneumothorax. No acute osseous findings.
IMPRESSION: Stable chest.  No active cardiopulmonary process.

## 2021-06-21 ENCOUNTER — Other Ambulatory Visit: Payer: Self-pay

## 2021-06-21 ENCOUNTER — Ambulatory Visit: Payer: Medicare HMO | Admitting: Podiatry

## 2021-06-21 DIAGNOSIS — B351 Tinea unguium: Secondary | ICD-10-CM

## 2021-06-21 DIAGNOSIS — M79675 Pain in left toe(s): Secondary | ICD-10-CM | POA: Diagnosis not present

## 2021-06-21 DIAGNOSIS — M79674 Pain in right toe(s): Secondary | ICD-10-CM | POA: Diagnosis not present

## 2021-06-24 NOTE — Progress Notes (Signed)
Subjective: 75 year old female presents the office today for concerns of thick, discolored toenails most of her big toenails right side worse than left.  I previously saw her in 2020 and I had the left side debrided she states has been doing well. On the right side is very thick and discolored and she is asking for this To be trimmed today.  This been ongoing for many years.  Denies any systemic complaints such as fevers, chills, nausea, vomiting. No acute changes since last appointment, and no other complaints at this time.   Objective: AAO x3, NAD DP/PT pulses palpable bilaterally, CRT less than 3 seconds Bilateral hallux nails appear to be significantly hypertrophic, dystrophic with brown discoloration on the right side worse than left.  Subungual debris is present.  No edema, erythema or signs of infection.  No open lesions. No open lesions or pre-ulcerative lesions.  No pain with calf compression, swelling, warmth, erythema  Assessment: Symptomatic onychodystrophy/onychomycosis bilateral hallux  Plan: -All treatment options discussed with the patient including all alternatives, risks, complications.  -Sharply debrided the nails x2 without any complications or bleeding.  Able debride subungual debris.  Discussed urea nail gel.  As a courtesy debrided the nails without any complications or bleeding.  Consider total nail removal of the hallux nails. -Patient encouraged to call the office with any questions, concerns, change in symptoms.   Vivi Barrack DPM
